# Patient Record
Sex: Female | Born: 1975 | Race: Black or African American
Health system: Southern US, Community
[De-identification: ages and names within clinical notes are randomized; demographics above are authoritative.]

## PROBLEM LIST (undated history)

## (undated) DIAGNOSIS — G459 Transient cerebral ischemic attack, unspecified: Secondary | ICD-10-CM

## (undated) DIAGNOSIS — F419 Anxiety disorder, unspecified: Secondary | ICD-10-CM

## (undated) DIAGNOSIS — I219 Acute myocardial infarction, unspecified: Secondary | ICD-10-CM

## (undated) HISTORY — PX: TUBAL LIGATION: SHX77

---

## 2011-11-26 ENCOUNTER — Emergency Department (HOSPITAL_COMMUNITY)
Admission: EM | Admit: 2011-11-26 | Discharge: 2011-11-27 | Disposition: A | Discharge status: Home / Self Care | Payer: BC Managed Care – PPO | Attending: Emergency Medicine | Admitting: Emergency Medicine

## 2011-11-26 ENCOUNTER — Encounter (HOSPITAL_COMMUNITY): Payer: Self-pay | Admitting: Emergency Medicine

## 2011-11-26 ENCOUNTER — Emergency Department (HOSPITAL_COMMUNITY): Payer: BC Managed Care – PPO

## 2011-11-26 DIAGNOSIS — F419 Anxiety disorder, unspecified: Secondary | ICD-10-CM

## 2011-11-26 DIAGNOSIS — R0602 Shortness of breath: Secondary | ICD-10-CM | POA: Insufficient documentation

## 2011-11-26 DIAGNOSIS — R0789 Other chest pain: Secondary | ICD-10-CM

## 2011-11-26 DIAGNOSIS — R42 Dizziness and giddiness: Secondary | ICD-10-CM | POA: Insufficient documentation

## 2011-11-26 DIAGNOSIS — F411 Generalized anxiety disorder: Secondary | ICD-10-CM | POA: Insufficient documentation

## 2011-11-26 HISTORY — DX: Acute myocardial infarction, unspecified: I21.9

## 2011-11-26 HISTORY — DX: Anxiety disorder, unspecified: F41.9

## 2011-11-26 HISTORY — DX: Transient cerebral ischemic attack, unspecified: G45.9

## 2011-11-26 LAB — RAPID URINE DRUG SCREEN, HOSP PERFORMED
Amphetamines: NOT DETECTED
Benzodiazepines: NOT DETECTED
Cocaine: NOT DETECTED
Opiates: NOT DETECTED
Tetrahydrocannabinol: POSITIVE — AB

## 2011-11-26 LAB — COMPREHENSIVE METABOLIC PANEL
ALT: 9 U/L (ref 0–35)
AST: 15 U/L (ref 0–37)
Albumin: 3.7 g/dL (ref 3.5–5.2)
CO2: 25 mEq/L (ref 19–32)
Calcium: 9.5 mg/dL (ref 8.4–10.5)
GFR calc non Af Amer: >90 mL/min (ref >90)
Sodium: 135 mEq/L (ref 135–145)
Total Protein: 7.2 g/dL (ref 6.0–8.3)

## 2011-11-26 LAB — URINALYSIS, ROUTINE W REFLEX MICROSCOPIC
Bilirubin Urine: NEGATIVE
Glucose, UA: NEGATIVE mg/dL
Hgb urine dipstick: NEGATIVE
Specific Gravity, Urine: 1.017 (ref 1.005–1.030)
pH: 6.5 (ref 5.0–8.0)

## 2011-11-26 LAB — CBC WITH DIFFERENTIAL/PLATELET
Basophils Absolute: 0 10*3/uL (ref 0.0–0.1)
Eosinophils Relative: 1 % (ref 0–5)
Lymphocytes Relative: 40 % (ref 12–46)
MCV: 81.2 fL (ref 78.0–100.0)
Platelets: 275 10*3/uL (ref 150–400)
RDW: 13.2 % (ref 11.5–15.5)
WBC: 12.2 10*3/uL — ABNORMAL HIGH (ref 4.0–10.5)

## 2011-11-26 LAB — URINE MICROSCOPIC-ADD ON

## 2011-11-26 MED ORDER — ALPRAZOLAM 0.25 MG PO TABS: 0.2500 mg | ORAL_TABLET | Freq: Every evening | ORAL | Status: DC | PRN

## 2011-11-26 MED ORDER — SODIUM CHLORIDE 0.9 % IV BOLUS (SEPSIS)
1000.0000 mL | Freq: Once | INTRAVENOUS | Status: AC
  Administered 2011-11-26: 1000 mL via INTRAVENOUS

## 2011-11-26 NOTE — ED Notes (Signed)
Pt brought via EMS for c/o CP. Pt hx of MI per pt report. Pt recently moved to area. BASA x4, SL nitro x1, 20g Lt FA  given by EMS. Pt currently pain free

## 2011-11-26 NOTE — ED Notes (Signed)
Gave new ECG to Dr. Baldo Ash after I performed.4:01 pm JG.
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
## 2011-11-26 NOTE — ED Notes (Signed)
Pt slow to answer questions, unable to stay focused on conversation. Pt states she has been seen at [facility] but did not want to go there today.

## 2011-11-26 NOTE — ED Notes (Signed)
Patient settled to bed by self due to primary RN called away to code blue.Moved to hospital bed with assist of EMTs from stretcher.  Changed to hospital gown. Placed on monitor. Patient very adamant about needing to go to the bathroom, and walked self to the bathroom. Stated her left foot felt "funnier than the right". Able to walk independently. Appears tearful and upset. HR upper 40's to low/middle 50's. Otherwise VSS. Alert. Answers questions appropriately. Oriented to room and call light.

## 2011-11-26 NOTE — ED Provider Notes (Addendum)
History     CSN: [id number]  Arrival date & time 11/26/11  1548   First MD Initiated Contact with Patient 11/26/11 1620      Chief Complaint  Patient presents with  . Chest Pain    (Consider location/radiation/quality/duration/timing/severity/associated sxs/prior treatment) HPI Pt presents with several days of episodic CP, SOB, dizziness, blurred vision, tachypnea, paraesthesias associated with anxiety and stress at work. Though pt report previous TIA and MI she can not give details and states she thinks they were caused by stress. No current CP. Pt quite tearful and states she had been diagnosed with depression in the past but is no longer on medication. No unilateral lower ext pain or swelling. No focal weakness.  Past Medical History  Diagnosis Date  . TIA (transient ischemic attack)   . MI (myocardial infarction)   . Anxiety     Past Surgical History  Procedure Date  . Tubal ligation     History reviewed. No pertinent family history.  History  Substance Use Topics  . Smoking status: Current Every Day Smoker  . Smokeless tobacco: Not on file  . Alcohol Use: Yes    OB History    Grav Para Term Preterm Abortions TAB SAB Ect Mult Living                  Review of Systems  Constitutional: Positive for fatigue. Negative for fever and chills.  HENT: Negative for neck pain.   Eyes: Positive for visual disturbance. Negative for photophobia.  Respiratory: Positive for shortness of breath.   Cardiovascular: Positive for chest pain and palpitations. Negative for leg swelling.  Gastrointestinal: Negative for nausea, vomiting, abdominal pain, diarrhea and constipation.  Genitourinary: Negative for dysuria, frequency and flank pain.  Musculoskeletal: Negative for back pain.  Skin: Negative for rash and wound.  Neurological: Positive for dizziness and light-headedness. Negative for tremors, seizures, syncope, weakness, numbness and headaches.  Psychiatric/Behavioral:  Negative for suicidal ideas. The patient is nervous/anxious.     Allergies  Review of patient's allergies indicates no known allergies.  Home Medications   Current Outpatient Rx  Name Route Sig Dispense Refill  . ALPRAZOLAM 0.25 MG PO TABS Oral Take 1 tablet (0.25 mg total) by mouth at bedtime as needed for sleep. 30 tablet 0    BP 104/61  Resp 16  SpO2 100%  LMP 10/25/2011  Physical Exam  Nursing note and vitals reviewed. Constitutional: She is oriented to person, place, and time. She appears well-developed and well-nourished. No distress.       Emotionally labile  HENT:  Head: Normocephalic and atraumatic.  Mouth/Throat: Oropharynx is clear and moist.  Eyes: EOM are normal. Pupils are equal, round, and reactive to light.       Mildly injected sclera  Neck: Normal range of motion. Neck supple.  Cardiovascular: Normal rate and regular rhythm.   Pulmonary/Chest: Effort normal and breath sounds normal. No respiratory distress. She has no wheezes. She has no rales. She exhibits no tenderness.  Abdominal: Soft. Bowel sounds are normal. She exhibits no distension and no mass. There is no tenderness. There is no rebound and no guarding.  Musculoskeletal: Normal range of motion. She exhibits no edema and no tenderness.  Neurological: She is alert and oriented to person, place, and time.       5/5 motor in all ext. Sensation fully intact. Finger to nose intact  Skin: Skin is warm and dry. No rash noted. No erythema.  Psychiatric:  Anxious, emotionally labile    ED Course  Procedures (including critical care time)  Labs Reviewed  CBC WITH DIFFERENTIAL - Abnormal; Notable for the following:    WBC 12.2 (*)     MCHC 36.9 (*)     Lymphs Abs 4.9 (*)     All other components within normal limits  COMPREHENSIVE METABOLIC PANEL - Abnormal; Notable for the following:    Total Bilirubin 0.2 (*)     All other components within normal limits  URINALYSIS, ROUTINE W REFLEX  MICROSCOPIC - Abnormal; Notable for the following:    APPearance CLOUDY (*)     Leukocytes, UA SMALL (*)     All other components within normal limits  URINE RAPID DRUG SCREEN (HOSP PERFORMED) - Abnormal; Notable for the following:    Tetrahydrocannabinol POSITIVE (*)     All other components within normal limits  URINE MICROSCOPIC-ADD ON - Abnormal; Notable for the following:    Squamous Epithelial / LPF MANY (*)     All other components within normal limits  PREGNANCY, URINE  ETHANOL  POCT I-STAT TROPONIN I  TROPONIN I   Dg Chest 2 View  11/26/2011  *RADIOLOGY REPORT*  Clinical Data: SOB and chest pain  CHEST - 2 VIEW  Comparison: None.  Findings: The heart size and mediastinal contours are within normal limits.  Both lungs are clear.  The visualized skeletal structures are unremarkable.  IMPRESSION: Negative exam.   Original Report Authenticated By: Rosealee Albee, M.D.      1. Anxiety   2. Atypical chest pain       Date: 11/26/2011  Rate:46  Rhythm: sinus bradycardia  QRS Axis: normal  Intervals: PR prolonged  ST/T Wave abnormalities: normal  Conduction Disutrbances:first-degree A-V block   Narrative Interpretation:   Old EKG Reviewed: none available   MDM    Pt resting comfortably. Pain resolved. CAD unlikely. Trop x 2 neg. Symptoms likely due to stress and anxiety. Return for concerns      Loren Racer, MD 11/26/11 2154  Loren Racer, MD 11/26/11 2155

## 2014-04-18 ENCOUNTER — Emergency Department (HOSPITAL_BASED_OUTPATIENT_CLINIC_OR_DEPARTMENT_OTHER)
Admission: EM | Admit: 2014-04-18 | Discharge: 2014-04-19 | Disposition: A | Discharge status: Home / Self Care | Payer: Self-pay | Attending: Emergency Medicine | Admitting: Emergency Medicine

## 2014-04-18 ENCOUNTER — Encounter (HOSPITAL_BASED_OUTPATIENT_CLINIC_OR_DEPARTMENT_OTHER): Payer: Self-pay | Admitting: *Deleted

## 2014-04-18 ENCOUNTER — Emergency Department (HOSPITAL_BASED_OUTPATIENT_CLINIC_OR_DEPARTMENT_OTHER): Payer: Self-pay

## 2014-04-18 DIAGNOSIS — Z720 Tobacco use: Secondary | ICD-10-CM | POA: Insufficient documentation

## 2014-04-18 DIAGNOSIS — R51 Headache: Secondary | ICD-10-CM | POA: Insufficient documentation

## 2014-04-18 DIAGNOSIS — F419 Anxiety disorder, unspecified: Secondary | ICD-10-CM | POA: Insufficient documentation

## 2014-04-18 DIAGNOSIS — R531 Weakness: Secondary | ICD-10-CM | POA: Insufficient documentation

## 2014-04-18 DIAGNOSIS — I252 Old myocardial infarction: Secondary | ICD-10-CM | POA: Insufficient documentation

## 2014-04-18 DIAGNOSIS — R519 Headache, unspecified: Secondary | ICD-10-CM

## 2014-04-18 DIAGNOSIS — R197 Diarrhea, unspecified: Secondary | ICD-10-CM | POA: Insufficient documentation

## 2014-04-18 DIAGNOSIS — Z8673 Personal history of transient ischemic attack (TIA), and cerebral infarction without residual deficits: Secondary | ICD-10-CM | POA: Insufficient documentation

## 2014-04-18 DIAGNOSIS — Z3202 Encounter for pregnancy test, result negative: Secondary | ICD-10-CM | POA: Insufficient documentation

## 2014-04-18 LAB — BASIC METABOLIC PANEL
ANION GAP: 11 (ref 5–15)
BUN: 7 mg/dL (ref 6–23)
CALCIUM: 9.8 mg/dL (ref 8.4–10.5)
CHLORIDE: 104 mmol/L (ref 96–112)
CO2: 23 mmol/L (ref 19–32)
CREATININE: 0.82 mg/dL (ref 0.50–1.10)
GFR calc non Af Amer: 88 mL/min — ABNORMAL LOW (ref >90)
Glucose, Bld: 113 mg/dL — ABNORMAL HIGH (ref 70–99)
Potassium: 3.7 mmol/L (ref 3.5–5.1)
SODIUM: 138 mmol/L (ref 135–145)

## 2014-04-18 LAB — CBC WITH DIFFERENTIAL/PLATELET
Basophils Absolute: 0 10*3/uL (ref 0.0–0.1)
Basophils Relative: 0 % (ref 0–1)
Eosinophils Absolute: 0 10*3/uL (ref 0.0–0.7)
Eosinophils Relative: 0 % (ref 0–5)
HEMATOCRIT: 41 % (ref 36.0–46.0)
Hemoglobin: 15 g/dL (ref 12.0–15.0)
LYMPHS PCT: 15 % (ref 12–46)
Lymphs Abs: 3.3 10*3/uL (ref 0.7–4.0)
MCH: 29.8 pg (ref 26.0–34.0)
MCHC: 36.6 g/dL — AB (ref 30.0–36.0)
MCV: 81.5 fL (ref 78.0–100.0)
MONO ABS: 1.3 10*3/uL — AB (ref 0.1–1.0)
MONOS PCT: 6 % (ref 3–12)
NEUTROS ABS: 16.8 10*3/uL — AB (ref 1.7–7.7)
Neutrophils Relative %: 78 % — ABNORMAL HIGH (ref 43–77)
PLATELETS: 357 10*3/uL (ref 150–400)
RBC: 5.03 MIL/uL (ref 3.87–5.11)
RDW: 13.5 % (ref 11.5–15.5)
WBC: 21.4 10*3/uL — ABNORMAL HIGH (ref 4.0–10.5)

## 2014-04-18 LAB — URINE MICROSCOPIC-ADD ON

## 2014-04-18 LAB — URINALYSIS, ROUTINE W REFLEX MICROSCOPIC
BILIRUBIN URINE: NEGATIVE
Glucose, UA: NEGATIVE mg/dL
KETONES UR: 15 mg/dL — AB
NITRITE: NEGATIVE
PROTEIN: NEGATIVE mg/dL
Specific Gravity, Urine: 1.019 (ref 1.005–1.030)
UROBILINOGEN UA: 0.2 mg/dL (ref 0.0–1.0)
pH: 7.5 (ref 5.0–8.0)

## 2014-04-18 LAB — PREGNANCY, URINE: Preg Test, Ur: NEGATIVE

## 2014-04-18 MED ORDER — ONDANSETRON HCL 4 MG/2ML IJ SOLN
4.0000 mg | Freq: Once | INTRAMUSCULAR | Status: AC
Start: 2014-04-18 — End: 2014-04-18
  Administered 2014-04-18: 4 mg via INTRAVENOUS
  Filled 2014-04-18: qty 2

## 2014-04-18 MED ORDER — FENTANYL CITRATE 0.05 MG/ML IJ SOLN
50.0000 ug | Freq: Once | INTRAMUSCULAR | Status: AC
  Administered 2014-04-18: 50 ug via INTRAVENOUS
  Filled 2014-04-18: qty 2

## 2014-04-18 MED ORDER — METOCLOPRAMIDE HCL 5 MG/ML IJ SOLN
5.0000 mg | Freq: Once | INTRAMUSCULAR | Status: DC
Start: 2014-04-18 — End: 2014-04-18
  Filled 2014-04-18: qty 2

## 2014-04-18 MED ORDER — DIPHENHYDRAMINE HCL 50 MG/ML IJ SOLN
25.0000 mg | Freq: Once | INTRAMUSCULAR | Status: DC
  Filled 2014-04-18: qty 1

## 2014-04-18 MED ORDER — LORAZEPAM 2 MG/ML IJ SOLN
0.5000 mg | Freq: Once | INTRAMUSCULAR | Status: DC
  Filled 2014-04-18: qty 1

## 2014-04-18 NOTE — ED Provider Notes (Addendum)
CSN: [id number]     Arrival date & time 04/18/14  2003 History  This chart was scribed for Lorre NickAnthony Colston Pyle, MD by Luisa DagoPriscilla Tutu, ED Scribe. This patient was seen in room MH04/MH04 and the patient's care was started at 11:10 PM.  Chief Complaint  Patient presents with  . Emesis   The history is provided by the patient and medical records. No language interpreter was used.   HPI Comments: Lorraine Luna is a 39 y.o. female with PMhx of possible TIA 2012 presents to the Emergency Department complaining of weakness and pain that started at 6 o'clock this morning. Pt states after a BM this morning she immediately felt a sharp pain to her left forehead which radiated down her arms and legs. She states that at first she thought that the pain was secondary to straining but her symptoms did not resolve. Following this episode she started experiencing several episodes of emesis and diarrhea. She denies any similar symptoms in the past. Pt states that her symptoms are worsened each time she uses the restroom. She endorses a HA for the past 45 minutes. Pt reports taking Ibuprofen without adequate relief of her symptoms.   Pt's LNMP was 04/06/14. She states that she believes she has a subjective hormone imbalance for which she has been taking unprescribed estrogen for. Pt states that the estrogen has been helping a bit with alleviating her "hot flashes"   Past Medical History  Diagnosis Date  . TIA (transient ischemic attack)   . MI (myocardial infarction)   . Anxiety    Past Surgical History  Procedure Laterality Date  . Tubal ligation     No family history on file. History  Substance Use Topics  . Smoking status: Current Every Day Smoker -- 0.50 packs/day    Types: Cigarettes  . Smokeless tobacco: Not on file  . Alcohol Use: Yes   OB History    No data available     Review of Systems  Constitutional: Negative for fever and chills.  Cardiovascular: Negative for chest pain.  Gastrointestinal:  Positive for nausea, vomiting and diarrhea.  Neurological: Positive for weakness and headaches. Negative for numbness.  All other systems reviewed and are negative.  Allergies  Review of patient's allergies indicates no known allergies.  Home Medications   Prior to Admission medications   Medication Sig Start Date End Date Taking? Authorizing Provider  ALPRAZolam (XANAX) 0.25 MG tablet Take 1 tablet (0.25 mg total) by mouth at bedtime as needed for sleep. 11/26/11   Loren Raceravid Yelverton, MD   BP 110/82 mmHg  Pulse 62  Temp(Src) 98.5 F (36.9 C) (Oral)  Resp 20  Ht 5\' 5"  (1.651 m)  Wt 155 lb (70.308 kg)  BMI 25.79 kg/m2  SpO2 100%  LMP 04/06/2014  Physical Exam  Constitutional: She is oriented to person, place, and time. She appears well-developed and well-nourished.  Non-toxic appearance. No distress.  HENT:  Head: Normocephalic and atraumatic.  Eyes: Conjunctivae, EOM and lids are normal. Pupils are equal, round, and reactive to light.  Neck: Normal range of motion. Neck supple. No tracheal deviation present. No thyroid mass present.  Cardiovascular: Normal rate, regular rhythm and normal heart sounds.  Exam reveals no gallop.   No murmur heard. Pulmonary/Chest: Effort normal and breath sounds normal. No stridor. No respiratory distress. She has no decreased breath sounds. She has no wheezes. She has no rhonchi. She has no rales.  Abdominal: Soft. Normal appearance and bowel sounds are normal. She exhibits  no distension. There is no tenderness. There is no rebound and no CVA tenderness.  Musculoskeletal: Normal range of motion. She exhibits no edema or tenderness.  Neurological: She is alert and oriented to person, place, and time. She has normal strength. No cranial nerve deficit or sensory deficit. GCS eye subscore is 4. GCS verbal subscore is 5. GCS motor subscore is 6.  Skin: Skin is warm and dry. No abrasion and no rash noted.  Psychiatric: She has a normal mood and affect. Her  speech is normal and behavior is normal.  Nursing note and vitals reviewed.   ED Course  Procedures (including critical care time)  DIAGNOSTIC STUDIES: Oxygen Saturation is 100% on RA, normal by my interpretation.    COORDINATION OF CARE: 11:22 PM- Pt advised of plan for treatment and pt agrees.  Labs Review Labs Reviewed  URINALYSIS, ROUTINE W REFLEX MICROSCOPIC - Abnormal; Notable for the following:    APPearance TURBID (*)    Hgb urine dipstick TRACE (*)    Ketones, ur 15 (*)    Leukocytes, UA SMALL (*)    All other components within normal limits  URINE MICROSCOPIC-ADD ON - Abnormal; Notable for the following:    Squamous Epithelial / LPF FEW (*)    Bacteria, UA FEW (*)    All other components within normal limits  PREGNANCY, URINE    Imaging Review No results found.   EKG Interpretation None      MDM   Final diagnoses:  None   Date: 04/19/2014  Rate: 43  Rhythm: sinus bradycardia  QRS Axis: normal  Intervals: normal  ST/T Wave abnormalities: normal  Conduction Disutrbances:none  Narrative Interpretation:   Old EKG Reviewed: none available    I personally performed the services described in this documentation, which was scribed in my presence. The recorded information has been reviewed and is accurate.   1:37 AM Patients repeat neurological exam remains nonfocal. Headache was greatly improved but she admits that she's been under a great deal of stress at this time. Notes that the headache does get worse when she becomes more stress. I do not think that her symptoms represent TIA. She has had numbness along with pain. Head CT reassuring. No evidence of meningeal signs. Stable for discharge  Lorre Nick, MD 04/19/14 1610  Lorre Nick, MD 04/19/14 [phone]

## 2014-04-18 NOTE — ED Notes (Signed)
3 episodes to today of: "while having a BM &/or voiding, developing sharp R sided facial pain, lasts ~ 40 minutes, concurrent with feeling hot and NV".  Occurred at 0630, 1300 and 1900. C/o feeling hot and nauseated now. Pain remains, although better now, 5/10, was 10/10.

## 2014-04-18 NOTE — ED Notes (Signed)
Pt pt jumped up to side of bed with severe sudden increase in L facial pain, also nv. Lasted a few minutes. Pt restless and hyperventilating. C/o "worst HA ever". No h/o same. Mentions L eye blurred vision.

## 2014-04-18 NOTE — ED Notes (Signed)
Vomiting, diarrhea, and headache since this am.

## 2014-04-18 NOTE — ED Notes (Signed)
[facility], orders received and cancelled, "pt feels better".

## 2014-04-19 MED ORDER — ALPRAZOLAM 0.25 MG PO TABS: 0.2500 mg | ORAL_TABLET | Freq: Three times a day (TID) | ORAL | Status: AC | PRN

## 2014-04-19 NOTE — ED Notes (Signed)
Up to b/r, steady gait, states, "a little fearful b/c that is when the episode start". Assisted by RN.
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
## 2014-04-19 NOTE — ED Notes (Signed)
returned to stretcher and monitor. VSS. SR up x2. CBIR.

## 2014-04-19 NOTE — Discharge Instructions (Signed)

## 2014-04-19 NOTE — ED Notes (Signed)
Pt using phone to call for ride

## 2016-10-24 IMAGING — CT CT HEAD W/O CM
1 series · 15 of 30 positions shown, 19 images · non-contrast
Comparison: None.

CLINICAL DATA: Sudden worsening of left facial pain, nausea and
vomiting. Worst headache ever. Left-sided blurred vision. Initial
encounter.

EXAM:
CT HEAD WITHOUT CONTRAST
TECHNIQUE: Contiguous axial images were obtained from the base of the skull
through the vertex without intravenous contrast.

[Series 2: head 4.8 h37s · axial · 0.47mm/px · z∈[-134,+22]mm · 15 of 36 slices shown, 19 images]
[im 2/36  brain]
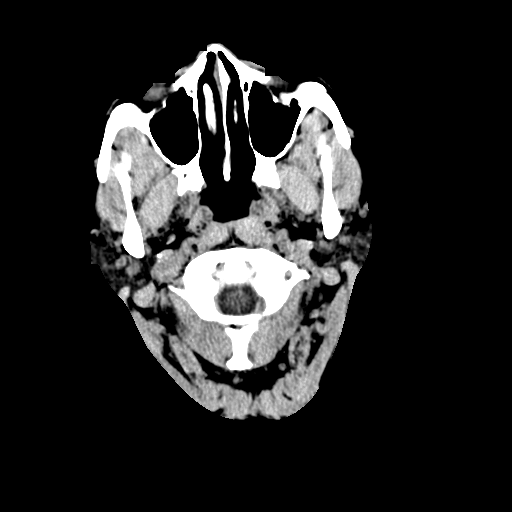
[im 2/36  bone]
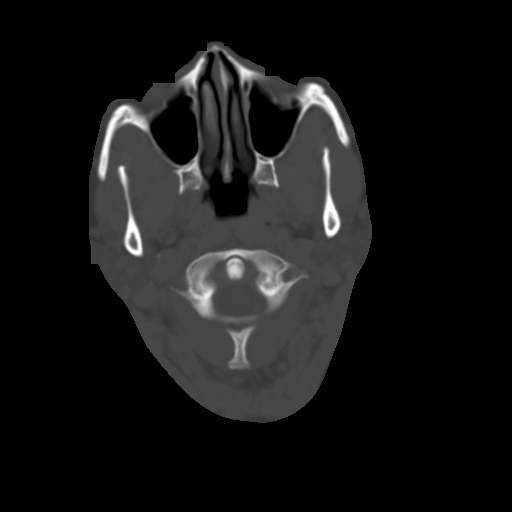
[im 4/36  brain]
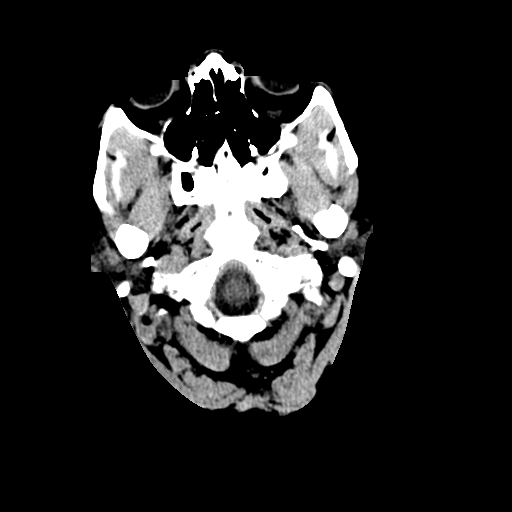
[im 7/36  brain]
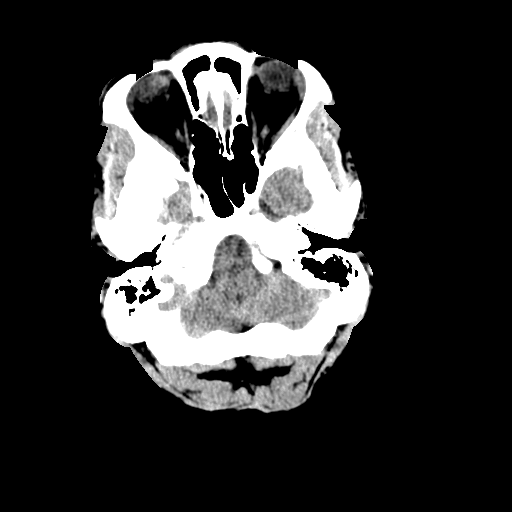
[im 9/36  brain]
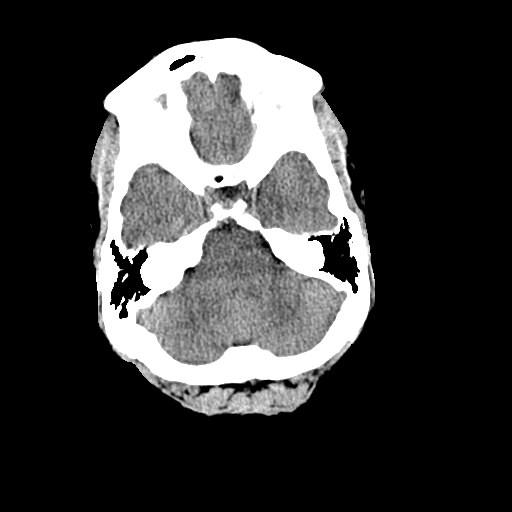
[im 11/36  brain]
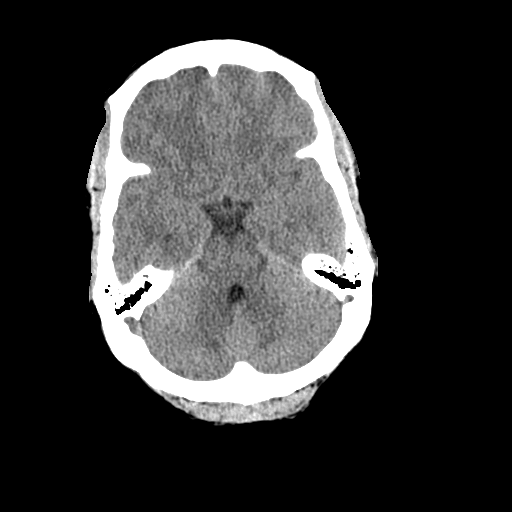
[im 11/36  bone]
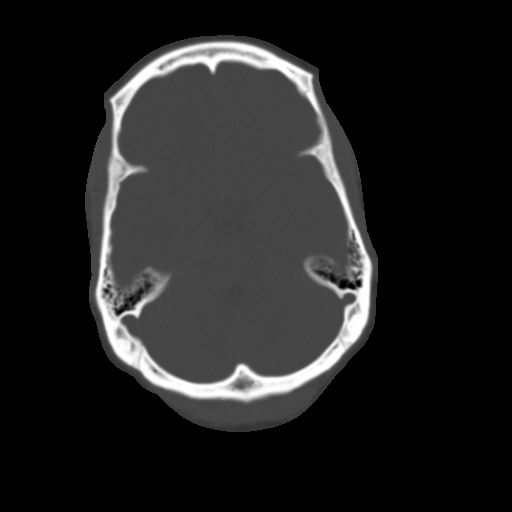
[im 14/36  brain]
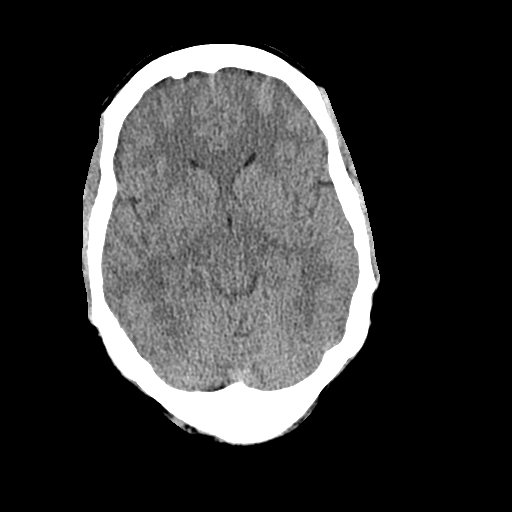
[im 16/36  brain]
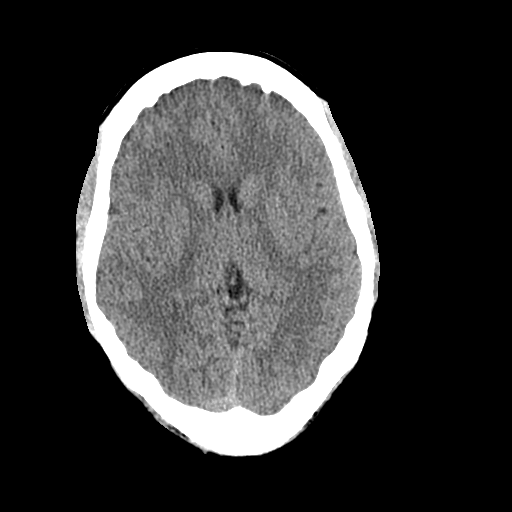
[im 19/36  brain]
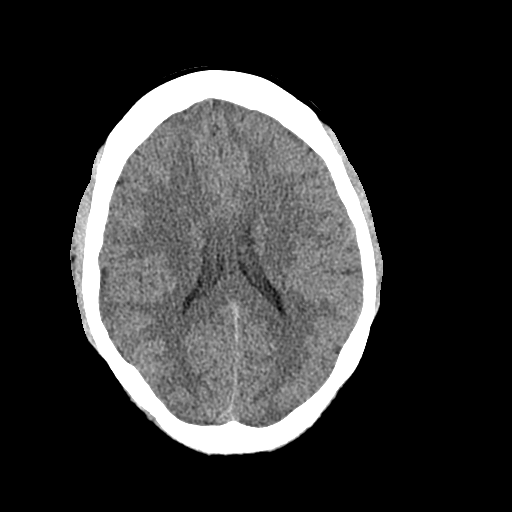
[im 20/36  brain]
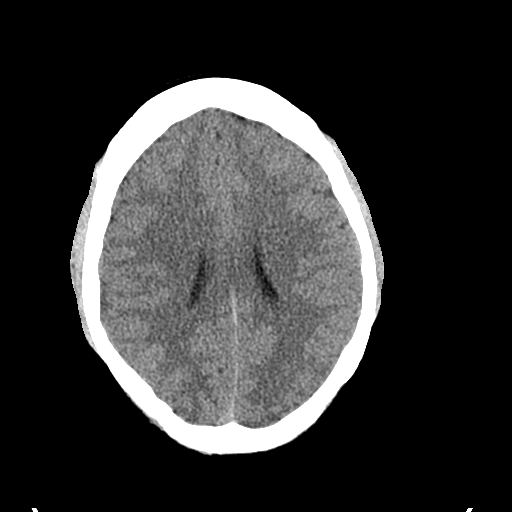
[im 20/36  bone]
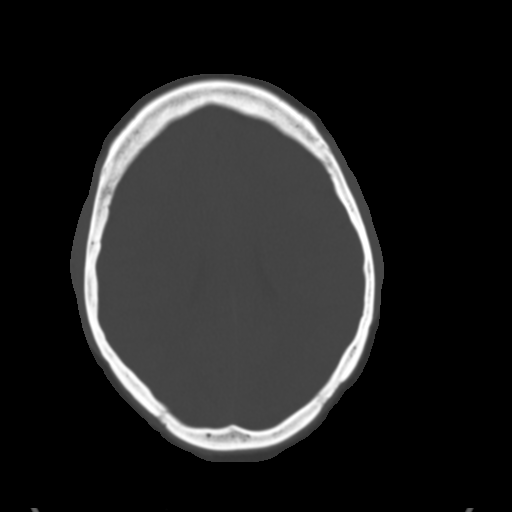
[im 22/36  brain]
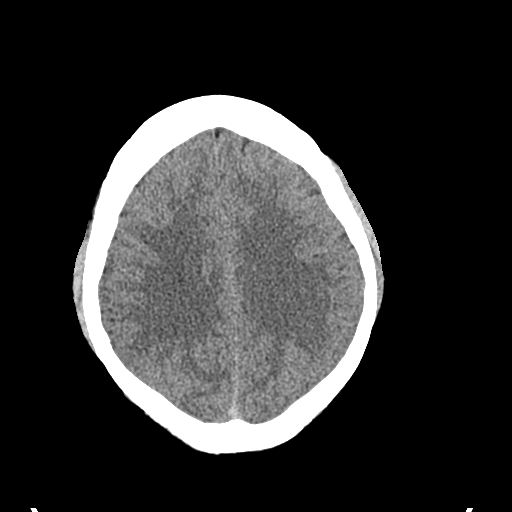
[im 25/36  brain]
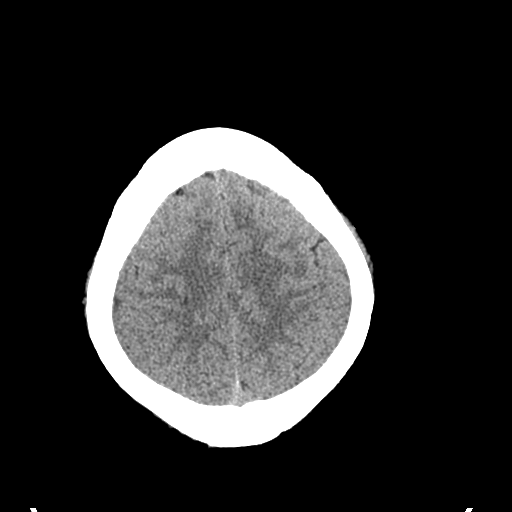
[im 27/36  brain]
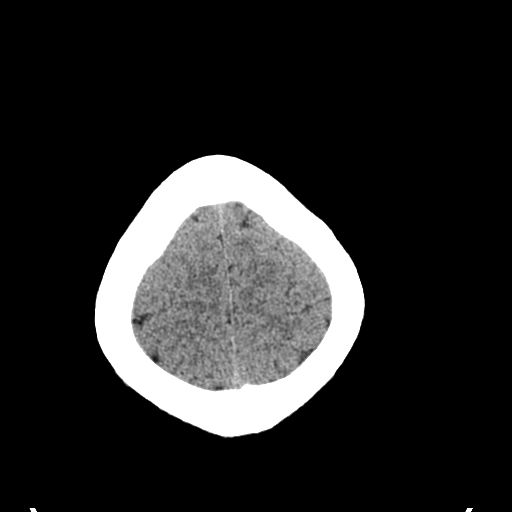
[im 29/36  brain]
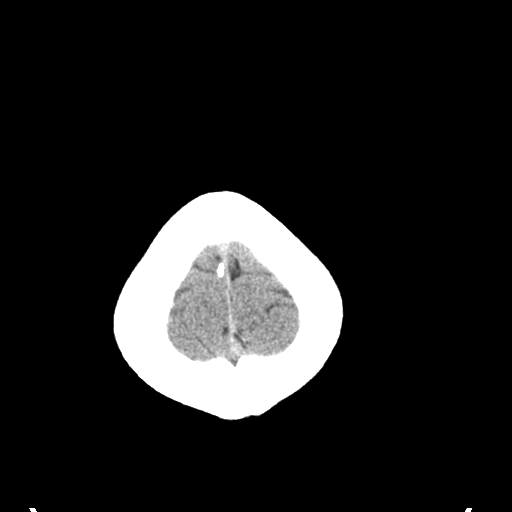
[im 29/36  bone]
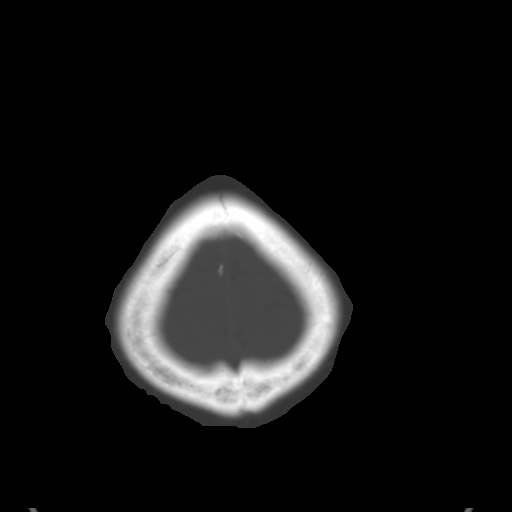
[im 32/36  brain]
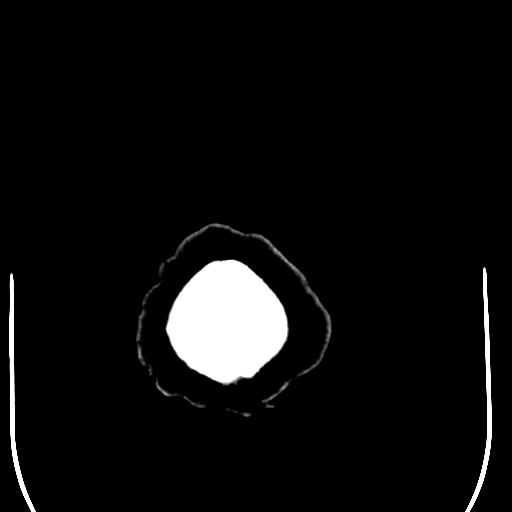
[im 34/36  brain]
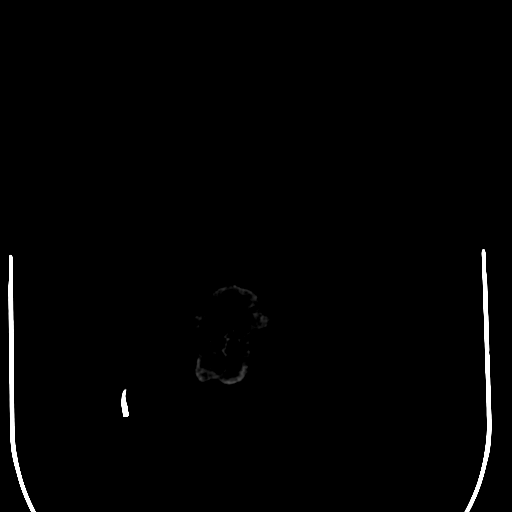

[15 of 30 positions shown; findings below may reference images not displayed]

FINDINGS: There is no evidence of acute infarction, mass lesion, or intra- or
extra-axial hemorrhage on CT. Minimally increased attenuation at the
left frontal lobe is thought to be artifactual in nature.

The posterior fossa, including the cerebellum, brainstem and fourth
ventricle, is within normal limits. The third and lateral
ventricles, and basal ganglia are unremarkable in appearance. The
cerebral hemispheres are symmetric in appearance, with normal
gray-white differentiation. No mass effect or midline shift is seen.

There is no evidence of fracture; visualized osseous structures are
unremarkable in appearance. The orbits are within normal limits. The
paranasal sinuses and mastoid air cells are well-aerated. No
significant soft tissue abnormalities are seen.
IMPRESSION: Unremarkable noncontrast CT of the head. Minimally increased
attenuation at the left frontal lobe is thought to be artifactual
nature, though if the patient's symptoms persist, further evaluation
could be considered.

## 2020-08-27 ENCOUNTER — Emergency Department: Payer: Medicaid (Managed Care)

## 2020-08-27 ENCOUNTER — Emergency Department
Admission: EM | Admit: 2020-08-27 | Discharge: 2020-08-27 | Disposition: A | Discharge status: Home / Self Care | Payer: Medicaid (Managed Care) | Attending: Emergency Medicine | Admitting: Emergency Medicine

## 2020-08-27 DIAGNOSIS — S2231XA Fracture of one rib, right side, initial encounter for closed fracture: Secondary | ICD-10-CM | POA: Insufficient documentation

## 2020-08-27 DIAGNOSIS — S301XXA Contusion of abdominal wall, initial encounter: Secondary | ICD-10-CM | POA: Insufficient documentation

## 2020-08-27 DIAGNOSIS — K529 Noninfective gastroenteritis and colitis, unspecified: Secondary | ICD-10-CM | POA: Insufficient documentation

## 2020-08-27 LAB — URINALYSIS, REFLEX TO MICROSCOPIC EXAM IF INDICATED
Bilirubin, UA: NEGATIVE
Blood, UA: NEGATIVE
Glucose, UA: NEGATIVE
Ketones UA: 20 — AB
Leukocyte Esterase, UA: NEGATIVE
Nitrite, UA: NEGATIVE
Protein, UR: 30 — AB
Specific Gravity UA: 1.033 (ref 1.001–1.035)
Urine pH: 6 (ref 5.0–8.0)
Urobilinogen, UA: NEGATIVE mg/dL (ref 0.2–2.0)

## 2020-08-27 LAB — CBC AND DIFFERENTIAL
Absolute NRBC: 0 10*3/uL (ref 0.00–0.00)
Basophils Absolute Automated: 0.04 10*3/uL (ref 0.00–0.08)
Basophils Automated: 0.2 %
Eosinophils Absolute Automated: 0.01 10*3/uL (ref 0.00–0.44)
Eosinophils Automated: 0.1 %
Hematocrit: 41.3 % (ref 34.7–43.7)
Hgb: 14.9 g/dL — ABNORMAL HIGH (ref 11.4–14.8)
Immature Granulocytes Absolute: 0.07 10*3/uL (ref 0.00–0.07)
Immature Granulocytes: 0.4 %
Lymphocytes Absolute Automated: 2.21 10*3/uL (ref 0.42–3.22)
Lymphocytes Automated: 11.6 %
MCH: 30.2 pg (ref 25.1–33.5)
MCHC: 36.1 g/dL — ABNORMAL HIGH (ref 31.5–35.8)
MCV: 83.8 fL (ref 78.0–96.0)
MPV: 9.4 fL (ref 8.9–12.5)
Monocytes Absolute Automated: 1.19 10*3/uL — ABNORMAL HIGH (ref 0.21–0.85)
Monocytes: 6.3 %
Neutrophils Absolute: 15.48 10*3/uL — ABNORMAL HIGH (ref 1.10–6.33)
Neutrophils: 81.4 %
Nucleated RBC: 0 /100 WBC (ref 0.0–0.0)
Platelets: 396 10*3/uL — ABNORMAL HIGH (ref 142–346)
RBC: 4.93 10*6/uL (ref 3.90–5.10)
RDW: 13 % (ref 11–15)
WBC: 19 10*3/uL — ABNORMAL HIGH (ref 3.10–9.50)

## 2020-08-27 LAB — COMPREHENSIVE METABOLIC PANEL
ALT: 17 U/L (ref 0–55)
AST (SGOT): 20 U/L (ref 5–34)
Albumin/Globulin Ratio: 1.3 (ref 0.9–2.2)
Albumin: 4.3 g/dL (ref 3.5–5.0)
Alkaline Phosphatase: 90 U/L (ref 37–117)
Anion Gap: 10 (ref 5.0–15.0)
BUN: 9 mg/dL (ref 7.0–19.0)
Bilirubin, Total: 0.6 mg/dL (ref 0.2–1.2)
CO2: 23 mEq/L (ref 22–29)
Calcium: 10 mg/dL (ref 8.5–10.5)
Chloride: 107 mEq/L (ref 100–111)
Creatinine: 0.7 mg/dL (ref 0.6–1.0)
Globulin: 3.3 g/dL (ref 2.0–3.6)
Glucose: 97 mg/dL (ref 70–100)
Potassium: 4.2 mEq/L (ref 3.5–5.1)
Protein, Total: 7.6 g/dL (ref 6.0–8.3)
Sodium: 140 mEq/L (ref 136–145)

## 2020-08-27 LAB — TYPE AND SCREEN
AB Screen Gel: NEGATIVE
ABO Rh: A POS

## 2020-08-27 LAB — GFR: EGFR: >60

## 2020-08-27 LAB — IHS 2ND ABORH REQUEST

## 2020-08-27 LAB — URINE BHCG POC: Urine bHCG POC: NEGATIVE

## 2020-08-27 MED ORDER — OXYCODONE-ACETAMINOPHEN 5-325 MG PO TABS
1.0000 | ORAL_TABLET | Freq: Once | ORAL | Status: AC
Start: 2020-08-27 — End: 2020-08-27
  Administered 2020-08-27: 1 via ORAL
  Filled 2020-08-27: qty 1

## 2020-08-27 MED ORDER — LIDOCAINE 5 % EX PTCH
1.0000 | MEDICATED_PATCH | CUTANEOUS | Status: DC
Start: 2020-08-27 — End: 2020-08-27
  Administered 2020-08-27: 1 via TRANSDERMAL
  Filled 2020-08-27: qty 1

## 2020-08-27 MED ORDER — OXYCODONE-ACETAMINOPHEN 5-325 MG PO TABS
1.0000 | ORAL_TABLET | ORAL | 0 refills | Status: DC | PRN
Start: 2020-08-27 — End: 2020-09-01

## 2020-08-27 MED ORDER — CYCLOBENZAPRINE HCL 10 MG PO TABS
10.0000 mg | ORAL_TABLET | Freq: Three times a day (TID) | ORAL | 0 refills | Status: AC | PRN
Start: 2020-08-27 — End: 2020-09-11

## 2020-08-27 MED ORDER — ONDANSETRON HCL 4 MG/2ML IJ SOLN
4.0000 mg | Freq: Once | INTRAMUSCULAR | Status: AC
Start: 2020-08-27 — End: 2020-08-27
  Administered 2020-08-27: 4 mg via INTRAVENOUS
  Filled 2020-08-27: qty 2

## 2020-08-27 MED ORDER — SODIUM CHLORIDE 0.9 % IV BOLUS
1000.0000 mL | Freq: Once | INTRAVENOUS | Status: AC
Start: 2020-08-27 — End: 2020-08-27
  Administered 2020-08-27: 1000 mL via INTRAVENOUS

## 2020-08-27 MED ORDER — IOHEXOL 350 MG/ML IV SOLN
80.0000 mL | Freq: Once | INTRAVENOUS | Status: AC | PRN
Start: 2020-08-27 — End: 2020-08-27
  Administered 2020-08-27: 80 mL via INTRAVENOUS

## 2020-08-27 MED ORDER — FENTANYL CITRATE (PF) 50 MCG/ML IJ SOLN (WRAP)
50.0000 ug | Freq: Once | INTRAMUSCULAR | Status: AC
Start: 2020-08-27 — End: 2020-08-27
  Administered 2020-08-27: 50 ug via INTRAVENOUS
  Filled 2020-08-27: qty 2
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
## 2020-08-27 NOTE — Discharge Instructions (Addendum)
Please follow-up with your primary care physician in 1 to 2 days for further evaluation.
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
## 2020-08-28 LAB — ECG 12-LEAD
Atrial Rate: 77 {beats}/min
IHS MUSE NARRATIVE AND IMPRESSION: NORMAL
P Axis: 71 degrees
P-R Interval: 180 ms
Q-T Interval: 366 ms
QRS Duration: 90 ms
QTC Calculation (Bezet): 414 ms
R Axis: 56 degrees
T Axis: 44 degrees
Ventricular Rate: 77 {beats}/min

## 2020-09-01 ENCOUNTER — Emergency Department
Admission: EM | Admit: 2020-09-01 | Discharge: 2020-09-01 | Disposition: A | Discharge status: Home / Self Care | Payer: Medicaid (Managed Care) | Attending: Emergency Medicine | Admitting: Emergency Medicine

## 2020-09-01 ENCOUNTER — Emergency Department: Payer: Medicaid (Managed Care)

## 2020-09-01 DIAGNOSIS — N83201 Unspecified ovarian cyst, right side: Secondary | ICD-10-CM | POA: Insufficient documentation

## 2020-09-01 DIAGNOSIS — S301XXA Contusion of abdominal wall, initial encounter: Secondary | ICD-10-CM | POA: Insufficient documentation

## 2020-09-01 DIAGNOSIS — K5903 Drug induced constipation: Secondary | ICD-10-CM | POA: Insufficient documentation

## 2020-09-01 DIAGNOSIS — S2241XA Multiple fractures of ribs, right side, initial encounter for closed fracture: Secondary | ICD-10-CM | POA: Insufficient documentation

## 2020-09-01 LAB — CBC AND DIFFERENTIAL
Absolute NRBC: 0 10*3/uL (ref 0.00–0.00)
Basophils Absolute Automated: 0.04 10*3/uL (ref 0.00–0.08)
Basophils Automated: 0.3 %
Eosinophils Absolute Automated: 0.1 10*3/uL (ref 0.00–0.44)
Eosinophils Automated: 0.8 %
Hematocrit: 38.8 % (ref 34.7–43.7)
Hgb: 14 g/dL (ref 11.4–14.8)
Immature Granulocytes Absolute: 0.04 10*3/uL (ref 0.00–0.07)
Immature Granulocytes: 0.3 %
Lymphocytes Absolute Automated: 2.54 10*3/uL (ref 0.42–3.22)
Lymphocytes Automated: 21.5 %
MCH: 30.3 pg (ref 25.1–33.5)
MCHC: 36.1 g/dL — ABNORMAL HIGH (ref 31.5–35.8)
MCV: 84 fL (ref 78.0–96.0)
MPV: 9.8 fL (ref 8.9–12.5)
Monocytes Absolute Automated: 0.64 10*3/uL (ref 0.21–0.85)
Monocytes: 5.4 %
Neutrophils Absolute: 8.44 10*3/uL — ABNORMAL HIGH (ref 1.10–6.33)
Neutrophils: 71.7 %
Nucleated RBC: 0 /100 WBC (ref 0.0–0.0)
Platelets: 370 10*3/uL — ABNORMAL HIGH (ref 142–346)
RBC: 4.62 10*6/uL (ref 3.90–5.10)
RDW: 13 % (ref 11–15)
WBC: 11.8 10*3/uL — ABNORMAL HIGH (ref 3.10–9.50)

## 2020-09-01 LAB — URINE BHCG POC: Urine bHCG POC: NEGATIVE

## 2020-09-01 LAB — COMPREHENSIVE METABOLIC PANEL
ALT: 15 U/L (ref 0–55)
AST (SGOT): 21 U/L (ref 5–34)
Albumin/Globulin Ratio: 1.1 (ref 0.9–2.2)
Albumin: 3.6 g/dL (ref 3.5–5.0)
Alkaline Phosphatase: 72 U/L (ref 37–117)
Anion Gap: 9 (ref 5.0–15.0)
BUN: 8 mg/dL (ref 7.0–19.0)
Bilirubin, Total: 0.4 mg/dL (ref 0.2–1.2)
CO2: 23 mEq/L (ref 22–29)
Calcium: 9.6 mg/dL (ref 8.5–10.5)
Chloride: 108 mEq/L (ref 100–111)
Creatinine: 0.8 mg/dL (ref 0.6–1.0)
Globulin: 3.2 g/dL (ref 2.0–3.6)
Glucose: 95 mg/dL (ref 70–100)
Potassium: 4.5 mEq/L (ref 3.5–5.1)
Protein, Total: 6.8 g/dL (ref 6.0–8.3)
Sodium: 140 mEq/L (ref 136–145)

## 2020-09-01 LAB — URINALYSIS REFLEX TO MICROSCOPIC EXAM - REFLEX TO CULTURE
Bilirubin, UA: NEGATIVE
Blood, UA: NEGATIVE
Glucose, UA: NEGATIVE
Ketones UA: NEGATIVE
Nitrite, UA: NEGATIVE
Protein, UR: NEGATIVE
Specific Gravity UA: 1.011 (ref 1.001–1.035)
Urine pH: 8 (ref 5.0–8.0)
Urobilinogen, UA: NEGATIVE mg/dL (ref 0.2–2.0)

## 2020-09-01 LAB — GFR: EGFR: >60

## 2020-09-01 LAB — LIPASE: Lipase: 22 U/L (ref 8–78)

## 2020-09-01 MED ORDER — KETOROLAC TROMETHAMINE 30 MG/ML IJ SOLN
30.0000 mg | Freq: Once | INTRAMUSCULAR | Status: AC
Start: 2020-09-01 — End: 2020-09-01
  Administered 2020-09-01: 30 mg via INTRAVENOUS
  Filled 2020-09-01: qty 1

## 2020-09-01 MED ORDER — IOHEXOL 350 MG/ML IV SOLN
80.0000 mL | Freq: Once | INTRAVENOUS | Status: AC | PRN
Start: 2020-09-01 — End: 2020-09-01
  Administered 2020-09-01: 80 mL via INTRAVENOUS

## 2020-09-01 MED ORDER — ACETAMINOPHEN 325 MG PO TABS
650.0000 mg | ORAL_TABLET | Freq: Once | ORAL | Status: AC
Start: 2020-09-01 — End: 2020-09-01
  Administered 2020-09-01: 650 mg via ORAL
  Filled 2020-09-01: qty 2

## 2020-09-01 MED ORDER — OXYCODONE-ACETAMINOPHEN 5-325 MG PO TABS
1.0000 | ORAL_TABLET | Freq: Four times a day (QID) | ORAL | 0 refills | Status: DC | PRN
Start: 2020-09-01 — End: 2020-09-01

## 2020-09-01 MED ORDER — SODIUM CHLORIDE 0.9 % IV BOLUS
1000.0000 mL | Freq: Once | INTRAVENOUS | Status: AC
Start: 2020-09-01 — End: 2020-09-01
  Administered 2020-09-01: 1000 mL via INTRAVENOUS

## 2020-09-01 MED ORDER — BISACODYL 5 MG PO TBEC
10.0000 mg | DELAYED_RELEASE_TABLET | Freq: Once | ORAL | Status: AC
Start: 2020-09-01 — End: 2020-09-01
  Administered 2020-09-01: 10 mg via ORAL
  Filled 2020-09-01: qty 2

## 2020-09-01 MED ORDER — MORPHINE SULFATE 4 MG/ML IJ/IV SOLN (WRAP)
4.0000 mg | Freq: Once | Status: AC
Start: 2020-09-01 — End: 2020-09-01
  Administered 2020-09-01: 4 mg via INTRAVENOUS
  Filled 2020-09-01: qty 1

## 2020-09-01 MED ORDER — OXYCODONE-ACETAMINOPHEN 5-325 MG PO TABS
1.0000 | ORAL_TABLET | Freq: Four times a day (QID) | ORAL | 0 refills | Status: AC | PRN
Start: 2020-09-01 — End: 2020-09-11

## 2020-09-01 MED ORDER — ONDANSETRON HCL 4 MG/2ML IJ SOLN
4.0000 mg | Freq: Once | INTRAMUSCULAR | Status: AC
Start: 2020-09-01 — End: 2020-09-01
  Administered 2020-09-01: 4 mg via INTRAVENOUS
  Filled 2020-09-01: qty 2

## 2020-09-01 MED ORDER — DOCUSATE SODIUM 100 MG PO CAPS
100.0000 mg | ORAL_CAPSULE | Freq: Two times a day (BID) | ORAL | 0 refills | Status: AC | PRN
Start: 2020-09-01 — End: 2020-09-08

## 2020-09-01 NOTE — ED Triage Notes (Signed)
[facility] DEPART[facility] MEDICAL SCREENING EXAM NOTE        Patient Name: Chelsea Mcdonald    Chief Complaint:   Chief Complaint   Patient presents with    Abdominal Pain       HPI: KAMPBELL HOLAWAY is a 45 y.o. female, who has had a rapid medical screening evaluation initiated by myself for the chief complaint of abdominal distension, n/v, constipation.   Patient seen 5 days ago for rib fracture and likely ruptures R ovarian cyst.  CT scan at that time showed nondisplaced posterior 11th rib fx, no solid organ injury, incidental right adnexal cyst.      ROS: Please see HPI.     Medical/Surgical/Social history: as per HPI    Vitals: BP 143/87   Pulse 60   Temp 98.1 F (36.7 C) (Oral)   Resp 15   Ht 5\' 5"  (1.651 m)   Wt 70.3 kg   LMP 08/13/2020 (Approximate)   SpO2 100%   BMI 25.79 kg/m     Pertinent brief exam:   Constitutional: No acute distress.Well-nourished.  Cardiovascular: Normal heart rate.   Pulmonary: Normal effort.  No respiratory distress. No stridor.  Skin: Normal skin color. No diaphoresis.  Psych: Normal affect.     Additional pertinent physical exam findings: none    Preliminary orders: labs, CT    Symptom based diagnosis: abdominal pain, distension      Patient advised to remain in the ED until further evaluation can be performed. Patient instructed to notify staff of any changes in condition while waiting.    This assessment is an initial evaluation to expedite care.
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
